# Patient Record
Sex: Female | Born: 1985
Health system: Southern US, Community
[De-identification: ages and names within clinical notes are randomized; demographics above are authoritative.]

---

## 2009-11-09 ENCOUNTER — Emergency Department: Payer: Self-pay | Admitting: Emergency Medicine

## 2009-12-31 ENCOUNTER — Emergency Department: Payer: Self-pay | Admitting: Emergency Medicine

## 2010-07-07 ENCOUNTER — Emergency Department: Payer: Self-pay | Admitting: Emergency Medicine

## 2011-01-01 ENCOUNTER — Observation Stay: Payer: Self-pay | Admitting: Obstetrics & Gynecology

## 2011-04-22 ENCOUNTER — Emergency Department: Payer: Self-pay | Admitting: Unknown Physician Specialty

## 2011-12-06 ENCOUNTER — Emergency Department: Payer: Self-pay | Admitting: Emergency Medicine

## 2011-12-06 LAB — COMPREHENSIVE METABOLIC PANEL
Alkaline Phosphatase: 59 U/L (ref 50–136)
Anion Gap: 5 — ABNORMAL LOW (ref 7–16)
BUN: 14 mg/dL (ref 7–18)
Bilirubin,Total: 0.6 mg/dL (ref 0.2–1.0)
Calcium, Total: 9.2 mg/dL (ref 8.5–10.1)
Co2: 28 mmol/L (ref 21–32)
EGFR (African American): >60
EGFR (Non-African Amer.): >60
Glucose: 90 mg/dL (ref 65–99)
Osmolality: 285 (ref 275–301)
Potassium: 3.9 mmol/L (ref 3.5–5.1)
SGOT(AST): 27 U/L (ref 15–37)
Sodium: 143 mmol/L (ref 136–145)
Total Protein: 7.8 g/dL (ref 6.4–8.2)

## 2011-12-06 LAB — CBC
HGB: 13.6 g/dL (ref 12.0–16.0)
MCH: 29.1 pg (ref 26.0–34.0)
MCHC: 32.3 g/dL (ref 32.0–36.0)
Platelet: 305 10*3/uL (ref 150–440)
RBC: 4.67 10*6/uL (ref 3.80–5.20)

## 2011-12-06 LAB — TSH: Thyroid Stimulating Horm: 0.54 u[IU]/mL

## 2012-02-24 ENCOUNTER — Emergency Department: Payer: Self-pay | Admitting: Emergency Medicine

## 2012-06-28 ENCOUNTER — Inpatient Hospital Stay: Payer: Self-pay | Admitting: Psychiatry

## 2012-06-28 LAB — DRUG SCREEN, URINE
Amphetamines, Ur Screen: NEGATIVE (ref ?–1000)
Benzodiazepine, Ur Scrn: POSITIVE (ref ?–200)
Cocaine Metabolite,Ur ~~LOC~~: POSITIVE (ref ?–300)
MDMA (Ecstasy)Ur Screen: NEGATIVE (ref ?–500)
Opiate, Ur Screen: POSITIVE (ref ?–300)

## 2012-06-28 LAB — CBC
HCT: 43.8 % (ref 35.0–47.0)
HGB: 14.3 g/dL (ref 12.0–16.0)
MCH: 30.1 pg (ref 26.0–34.0)
Platelet: 340 10*3/uL (ref 150–440)
RBC: 4.75 10*6/uL (ref 3.80–5.20)
RDW: 13.1 % (ref 11.5–14.5)
WBC: 11.2 10*3/uL — ABNORMAL HIGH (ref 3.6–11.0)

## 2012-06-28 LAB — COMPREHENSIVE METABOLIC PANEL
Alkaline Phosphatase: 61 U/L (ref 50–136)
BUN: 16 mg/dL (ref 7–18)
Bilirubin,Total: 0.3 mg/dL (ref 0.2–1.0)
Calcium, Total: 8.9 mg/dL (ref 8.5–10.1)
Chloride: 105 mmol/L (ref 98–107)
Creatinine: 0.92 mg/dL (ref 0.60–1.30)
EGFR (African American): >60
EGFR (Non-African Amer.): >60
Glucose: 84 mg/dL (ref 65–99)
Osmolality: 282 (ref 275–301)
Potassium: 4.1 mmol/L (ref 3.5–5.1)
SGPT (ALT): 37 U/L (ref 12–78)
Sodium: 141 mmol/L (ref 136–145)

## 2012-06-28 LAB — URINALYSIS, COMPLETE
Bilirubin,UR: NEGATIVE
Blood: NEGATIVE
Nitrite: NEGATIVE
Ph: 6 (ref 4.5–8.0)
Protein: NEGATIVE
RBC,UR: 4 /HPF (ref 0–5)
Squamous Epithelial: 4
WBC UR: 6 /HPF (ref 0–5)

## 2012-06-28 LAB — PREGNANCY, URINE: Pregnancy Test, Urine: NEGATIVE m[IU]/mL

## 2012-06-28 LAB — ETHANOL: Ethanol: <3 mg/dL

## 2012-06-28 LAB — TSH: Thyroid Stimulating Horm: 1.73 u[IU]/mL

## 2012-06-29 LAB — BEHAVIORAL MEDICINE 1 PANEL
Albumin: 3.2 g/dL — ABNORMAL LOW (ref 3.4–5.0)
Alkaline Phosphatase: 48 U/L — ABNORMAL LOW (ref 50–136)
Anion Gap: 6 — ABNORMAL LOW (ref 7–16)
BUN: 22 mg/dL — ABNORMAL HIGH (ref 7–18)
Basophil #: 0.1 10*3/uL (ref 0.0–0.1)
Basophil %: 0.6 %
Bilirubin,Total: 0.1 mg/dL — ABNORMAL LOW (ref 0.2–1.0)
Calcium, Total: 8.4 mg/dL — ABNORMAL LOW (ref 8.5–10.1)
Chloride: 109 mmol/L — ABNORMAL HIGH (ref 98–107)
Co2: 27 mmol/L (ref 21–32)
Creatinine: 0.7 mg/dL (ref 0.60–1.30)
EGFR (African American): >60
EGFR (Non-African Amer.): >60
Eosinophil #: 0.2 10*3/uL (ref 0.0–0.7)
Eosinophil %: 2.2 %
Glucose: 88 mg/dL (ref 65–99)
HCT: 39.5 % (ref 35.0–47.0)
HGB: 12.8 g/dL (ref 12.0–16.0)
Lymphocyte #: 3.2 10*3/uL (ref 1.0–3.6)
Lymphocyte %: 31.3 %
MCH: 30 pg (ref 26.0–34.0)
MCHC: 32.5 g/dL (ref 32.0–36.0)
MCV: 93 fL (ref 80–100)
Monocyte #: 0.8 x10 3/mm (ref 0.2–0.9)
Monocyte %: 7.7 %
Neutrophil #: 5.9 10*3/uL (ref 1.4–6.5)
Neutrophil %: 58.2 %
Osmolality: 286 (ref 275–301)
Platelet: 307 10*3/uL (ref 150–440)
Potassium: 4 mmol/L (ref 3.5–5.1)
RBC: 4.26 10*6/uL (ref 3.80–5.20)
RDW: 13.1 % (ref 11.5–14.5)
SGOT(AST): 25 U/L (ref 15–37)
SGPT (ALT): 29 U/L (ref 12–78)
Sodium: 142 mmol/L (ref 136–145)
Thyroid Stimulating Horm: 0.687 u[IU]/mL
Total Protein: 6.3 g/dL — ABNORMAL LOW (ref 6.4–8.2)
WBC: 10.1 10*3/uL (ref 3.6–11.0)

## 2012-06-29 LAB — URINALYSIS, COMPLETE
Bacteria: NONE SEEN
Bilirubin,UR: NEGATIVE
Blood: NEGATIVE
Glucose,UR: NEGATIVE mg/dL (ref 0–75)
Ketone: NEGATIVE
Nitrite: NEGATIVE
Ph: 7 (ref 4.5–8.0)
Protein: NEGATIVE
RBC,UR: 11 /HPF (ref 0–5)
Specific Gravity: 1.02 (ref 1.003–1.030)
Squamous Epithelial: 24
WBC UR: 17 /HPF (ref 0–5)

## 2012-10-17 ENCOUNTER — Inpatient Hospital Stay: Payer: Self-pay | Admitting: Psychiatry

## 2012-10-17 LAB — COMPREHENSIVE METABOLIC PANEL
Albumin: 4.1 g/dL (ref 3.4–5.0)
Alkaline Phosphatase: 59 U/L (ref 50–136)
BUN: 7 mg/dL (ref 7–18)
Bilirubin,Total: 0.9 mg/dL (ref 0.2–1.0)
Chloride: 108 mmol/L — ABNORMAL HIGH (ref 98–107)
Co2: 27 mmol/L (ref 21–32)
Creatinine: 0.89 mg/dL (ref 0.60–1.30)
EGFR (African American): >60
EGFR (Non-African Amer.): >60
Potassium: 3.3 mmol/L — ABNORMAL LOW (ref 3.5–5.1)
SGOT(AST): 45 U/L — ABNORMAL HIGH (ref 15–37)
SGPT (ALT): 36 U/L (ref 12–78)
Total Protein: 7.4 g/dL (ref 6.4–8.2)

## 2012-10-17 LAB — URINALYSIS, COMPLETE
Glucose,UR: NEGATIVE mg/dL (ref 0–75)
Nitrite: NEGATIVE
Ph: 5 (ref 4.5–8.0)
Specific Gravity: 1.029 (ref 1.003–1.030)
WBC UR: 13 /HPF (ref 0–5)

## 2012-10-17 LAB — CBC WITH DIFFERENTIAL/PLATELET
Basophil %: 0.6 %
Eosinophil #: 0.2 10*3/uL (ref 0.0–0.7)
Eosinophil %: 1.4 %
Lymphocyte #: 1.9 10*3/uL (ref 1.0–3.6)
Lymphocyte %: 16.7 %
MCHC: 33.6 g/dL (ref 32.0–36.0)
MCV: 90 fL (ref 80–100)
Monocyte %: 8.7 %
Neutrophil #: 8.1 10*3/uL — ABNORMAL HIGH (ref 1.4–6.5)
Neutrophil %: 72.6 %
Platelet: 247 10*3/uL (ref 150–440)
RBC: 4.25 10*6/uL (ref 3.80–5.20)
RDW: 12.7 % (ref 11.5–14.5)

## 2012-10-17 LAB — CBC
HGB: 13.5 g/dL (ref 12.0–16.0)
MCH: 29.9 pg (ref 26.0–34.0)
Platelet: 276 10*3/uL (ref 150–440)
RBC: 4.52 10*6/uL (ref 3.80–5.20)
RDW: 12.6 % (ref 11.5–14.5)
WBC: 23.1 10*3/uL — ABNORMAL HIGH (ref 3.6–11.0)

## 2012-10-17 LAB — DRUG SCREEN, URINE
Amphetamines, Ur Screen: NEGATIVE (ref ?–1000)
Barbiturates, Ur Screen: NEGATIVE (ref ?–200)
Benzodiazepine, Ur Scrn: POSITIVE (ref ?–200)
Cannabinoid 50 Ng, Ur ~~LOC~~: POSITIVE (ref ?–50)
Cocaine Metabolite,Ur ~~LOC~~: POSITIVE (ref ?–300)
MDMA (Ecstasy)Ur Screen: NEGATIVE (ref ?–500)
Methadone, Ur Screen: NEGATIVE (ref ?–300)
Opiate, Ur Screen: NEGATIVE (ref ?–300)
Phencyclidine (PCP) Ur S: NEGATIVE (ref ?–25)

## 2012-10-17 LAB — WET PREP, GENITAL

## 2012-10-17 LAB — ETHANOL: Ethanol %: <0.003 % (ref 0.000–0.080)

## 2012-10-17 LAB — GC/CHLAMYDIA PROBE AMP

## 2013-02-10 ENCOUNTER — Ambulatory Visit: Payer: Self-pay | Admitting: Emergency Medicine

## 2013-02-15 ENCOUNTER — Ambulatory Visit: Payer: Self-pay | Admitting: Family Medicine

## 2013-02-15 ENCOUNTER — Emergency Department: Payer: Self-pay | Admitting: Emergency Medicine

## 2013-02-21 ENCOUNTER — Emergency Department: Payer: Self-pay | Admitting: Emergency Medicine

## 2013-02-21 LAB — CBC
MCHC: 33.2 g/dL (ref 32.0–36.0)
MCV: 91 fL (ref 80–100)
Platelet: 235 10*3/uL (ref 150–440)
RDW: 12.6 % (ref 11.5–14.5)
WBC: 12.1 10*3/uL — ABNORMAL HIGH (ref 3.6–11.0)

## 2013-02-21 LAB — BASIC METABOLIC PANEL
BUN: 8 mg/dL (ref 7–18)
Chloride: 108 mmol/L — ABNORMAL HIGH (ref 98–107)
Co2: 27 mmol/L (ref 21–32)
EGFR (African American): >60
EGFR (Non-African Amer.): >60
Osmolality: 273 (ref 275–301)
Sodium: 138 mmol/L (ref 136–145)

## 2013-02-22 LAB — GC/CHLAMYDIA PROBE AMP

## 2013-06-10 ENCOUNTER — Emergency Department: Payer: Self-pay | Admitting: Emergency Medicine

## 2013-06-10 LAB — URINALYSIS, COMPLETE
Bilirubin,UR: NEGATIVE
Blood: NEGATIVE
GLUCOSE, UR: NEGATIVE mg/dL (ref 0–75)
LEUKOCYTE ESTERASE: NEGATIVE
Nitrite: NEGATIVE
PH: 5 (ref 4.5–8.0)
Specific Gravity: 1.029 (ref 1.003–1.030)
WBC UR: 6 /HPF (ref 0–5)

## 2013-06-10 LAB — BASIC METABOLIC PANEL
ANION GAP: 5 — AB (ref 7–16)
BUN: 7 mg/dL (ref 7–18)
CALCIUM: 9.1 mg/dL (ref 8.5–10.1)
Chloride: 105 mmol/L (ref 98–107)
Co2: 26 mmol/L (ref 21–32)
Creatinine: 0.58 mg/dL — ABNORMAL LOW (ref 0.60–1.30)
EGFR (African American): >60
Glucose: 85 mg/dL (ref 65–99)
OSMOLALITY: 269 (ref 275–301)
Potassium: 4 mmol/L (ref 3.5–5.1)
Sodium: 136 mmol/L (ref 136–145)

## 2013-06-10 LAB — CBC WITH DIFFERENTIAL/PLATELET
BASOS PCT: 0.2 %
Basophil #: 0 10*3/uL (ref 0.0–0.1)
Eosinophil #: 0 10*3/uL (ref 0.0–0.7)
Eosinophil %: 0.1 %
HCT: 39 % (ref 35.0–47.0)
HGB: 13.1 g/dL (ref 12.0–16.0)
LYMPHS ABS: 0.9 10*3/uL — AB (ref 1.0–3.6)
Lymphocyte %: 4.4 %
MCH: 30.9 pg (ref 26.0–34.0)
MCHC: 33.6 g/dL (ref 32.0–36.0)
MCV: 92 fL (ref 80–100)
MONO ABS: 0.8 x10 3/mm (ref 0.2–0.9)
MONOS PCT: 4.1 %
NEUTROS ABS: 17.6 10*3/uL — AB (ref 1.4–6.5)
Neutrophil %: 91.2 %
Platelet: 239 10*3/uL (ref 150–440)
RBC: 4.24 10*6/uL (ref 3.80–5.20)
RDW: 12.8 % (ref 11.5–14.5)
WBC: 19.3 10*3/uL — AB (ref 3.6–11.0)

## 2013-06-13 ENCOUNTER — Ambulatory Visit: Payer: Self-pay | Admitting: Family Medicine
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
## 2014-08-01 NOTE — Discharge Summary (Signed)
PATIENT NAME:  Debra RowanCLORE, Else L MR#:  [id number] DATE OF BIRTH:  [birth year]  DATE OF ADMISSION:  06/28/2012 DATE OF DISCHARGE:  07/02/2012  HOSPITAL COURSE: See dictated history and physical for details of admission. This 29 year old woman was petitioned for commitment after being found oversedated, probably having overdosed on benzodiazepines. The patient has consistently denied any suicidal ideation. She has not shown any dangerous or violent behavior in the hospital. She did not show any significant problems from withdrawal on medication. Benzodiazepines were discontinued and her affect and mood remained stable. The patient was counseled about the risks of abuse of benzodiazepine medications. Her plan is to follow up with Dr. Janeece RiggersSu in the community. She is to continue taking her current medication combination of Lamictal and Seroquel. The patient was treated with these medications and was monitored for any signs of withdrawal. She was treated also with daily individual and group psychotherapy. She showed improved insight.   DISCHARGE MEDICATIONS: Seroquel 150 mg at bedtime, Lamictal 200 mg twice a day.   LABORATORY RESULTS: Urinalysis showed positive leukocyte esterase, mixed red and white cells. She was treated for urinary tract infection during her hospital stay and has completed the treatment. Chemistry panel on admission showed chloride slightly elevated at 109, BUN elevated at 22, calcium low 8.4, bilirubin low at 0.1, alkaline phosphatase low at 48. CBC normal. Pregnancy test negative. Drug screen positive for cocaine and opiates and benzodiazepines.   MENTAL STATUS EXAM AT DISCHARGE: Neatly dressed and reasonably well-groomed woman who looks her stated age. Cooperative with the interview. Good eye contact, normal psychomotor activity. Speech is normal in rate, tone and volume. Affect mildly anxious, reactive and appropriate. Mood stated as okay. Thoughts lucid with no loosening of associations or  evidence of delusions. Denies auditory or visual hallucinations. Denies suicidal or homicidal ideation. Shows improved judgment and insight, normal intelligence,   DISPOSITION: Discharge home. Follow up with Dr. Janeece RiggersSu.   DIAGNOSIS PRINCIPAL AND PRIMARY: AXIS I: Bipolar disorder, not otherwise specified.    SECONDARY DIAGNOSES: AXIS I: Opiate and cocaine abuse, benzodiazepine dependence.   AXIS II: Deferred.   AXIS III: No diagnosis.   AXIS IV: Moderate from chronic illness and diminishing job prospects.  AXIS V: Functioning at time of discharge 50.      ____________________________ Audery AmelJohn T. Tyerra Loretto, MD jtc:cc D: 07/02/2012 17:32:52 ET T: 07/02/2012 20:59:34 ET JOB#: [id number]  cc: Audery AmelJohn T. Pahola Dimmitt, MD, <Dictator> Audery AmelJOHN T Charlynn Salih MD ELECTRONICALLY SIGNED 07/03/2012 11:18

## 2014-08-01 NOTE — Consult Note (Signed)
PATIENT NAME:  Debra Novak, Debra Novak MR#:  [id number] DATE OF BIRTH:  1985/10/11  DATE OF CONSULTATION:  06/28/2012  REFERRING PHYSICIAN:  Dorothea Glassman, MD CONSULTING PHYSICIAN:  Ardeen Fillers. Garnetta Buddy, MD  REASON FOR CONSULTATION:  Overdose on medication.   HISTORY OF PRESENT ILLNESS:  The patient is a 29 year old white female who presented to the ED after she overdosed on 30 plus pills off Klonopin. She presented to the ED oversedated, unable to state what happened to the pills and appeared to be very drowsy. She has a history of severe depression and is scheduled for cervical dysplasia surgery tomorrow at [facility]. She was unable to provide much information during the interview. Her pupils are dilated and she reported that she was given a Klonopin prescription for 90 pills on June 25, 2012. However, she was found oversedated at her home and at least 40 pills are missing from the bottle. The patient denied any suicidal ideations. She reported that she lives alone and nobody has access to her pills. She was assessed by Dr. Janeece Riggers in January and at that time she reported that she was struggling with her life. She admits to feeling depressed with crying spells, anhedonia, feeling hopeless, helpless as well as having passive suicidal thoughts. The patient during my interview reported that she has a history of cervical dysplasia which was diagnosed in 2011. She reported that she goes to [facility] on a regular basis and they have scheduled an outpatient procedure called LEEP so they can treat her for the same. She stated that she is very tired of this thing and she wants help. However, she was very agitated. When I asked her about her drug screen positive for cocaine, she reported that she used cocaine almost 2 weeks ago for 3 days in a row. She was minimizing her use of drugs and Klonopin at this time. She demonstrated poor insight about her use of Klonopin and the drugs.   PAST PSYCHIATRIC HISTORY:  The patient has a history of  depression and she has been following her physician on a regular basis, Dr. Janeece Riggers, who has been prescribing her medications. She is currently taking Seroquel, lamotrigine and Klonopin. She was unable to describe if the medications are effective as she is very sedated at this time. She denied any previous history of suicide attempts.   MEDICAL HISTORY:  The patient has history of cervical dysplasia. She also has history of D and C in the past.   CURRENT MEDICATIONS:  Seroquel XR 150 mg daily, lamotrigine 150 mg p.o. b.i.d., clonazepam 1 mg 2 to 3 times per day.   SOCIAL HISTORY:  The patient currently lives by herself.   DRUG HISTORY:  The patient reported that she used cocaine 2 to 3 weeks ago; however, her drug screen is positive at this time.   REVIEW OF SYSTEMS: CONSTITUTIONAL: The patient appeared very tired.  EYES: Her pupils appeared dilated.  RESPIRATORY: No shortness of breath or cough noted.  CARDIOVASCULAR: No chest pain, orthopnea.  GASTROINTESTINAL: No abdominal pain, nausea, vomiting or diarrhea.  GENITOURINARY: No incontinence or frequency.  ENDOCRINE: No heat or cold intolerance.  LYMPHATIC: No anemia or easy bruising.   PHYSICAL EXAMINATION:  VITAL SIGNS: Temperature 98, pulse 93, respirations 14, blood pressure 133/59.   LABORATORY DATA:  Glucose is 84, BUN 16, creatinine 0.92, sodium 141, potassium 4.1, chloride 105, bicarbonate 31, anion gap 5 and osmolality 282. Blood alcohol is less than 3. Protein is 7.7, albumin 3.9, bilirubin 0.3, alkaline  phosphatase 61, AST 31, ALT 37. TSH is 1.73. Urine drug screen is positive for benzodiazepines, cocaine and opioids. WBC is 11.2, RBC 4.75, hemoglobin 14.3, hematocrit 43.8, platelet count 340, MCH 30.1 and MCHC 32.7.   MENTAL STATUS EXAMINATION:  The patient is a disheveled-appearing female who was lying in the ED bed. Her eye contact was poor. Speech was slurred. Mood was depressed. Affect was anxious. Thought process was tangential.  Thought content was non-delusional. She demonstrated poor insight and judgment.   DIAGNOSTIC IMPRESSION: AXIS I:  1.  Bipolar disorder, not otherwise specified.  2.  Sedative, hypnotic, anxiolytic dependence.  3.  Cocaine abuse.  AXIS II: None.  AXIS III: Please review the medical history.   TREATMENT PLAN:   1.  The patient will be started on involuntary commitment at this time.  2.  She will be started on the CIWA protocol for Klonopin withdrawal.  3.  She will be continued on lamotrigine 100 mg at bedtime.  4.  She will be continued on Seroquel 50 mg at bedtime.  5.  Her medications will be adjusted in the inpatient behavioral health unit. Treatment team is to follow.   Thank you for allowing me to participate in the care of this patient.    ____________________________ Ardeen FillersUzma S. Garnetta BuddyFaheem, MD usf:si D: 06/28/2012 17:05:48 ET T: 06/28/2012 18:03:52 ET JOB#: [id number]  cc: Ardeen FillersUzma S. Garnetta BuddyFaheem, MD, <Dictator> Rhunette CroftUZMA S Jassmin Kemmerer MD ELECTRONICALLY SIGNED 07/02/2012 15:31

## 2014-11-25 IMAGING — US US PELV - US TRANSVAGINAL
1 series · 13 of 25 positions shown · non-contrast
Comparison: 02/15/2013.

CLINICAL DATA: Right lower quadrant pain. History of an
intrauterine device imbedded in the myometrium.

EXAM:
TRANSABDOMINAL AND TRANSVAGINAL ULTRASOUND OF PELVIS
DOPPLER ULTRASOUND OF OVARIES
TECHNIQUE: Both transabdominal and transvaginal ultrasound examinations of the
pelvis were performed. Transabdominal technique was performed for
global imaging of the pelvis including uterus, ovaries, adnexal
regions, and pelvic cul-de-sac.
It was necessary to proceed with endovaginal exam following the
transabdominal exam to visualize the uterus and ovaries in better
detail. Color and duplex Doppler ultrasound was utilized to evaluate
blood flow to the ovaries.

[Series 1: us pelv - us transvaginal · 0.28mm/px · 13 of 77 slices shown]
[im 1/77]
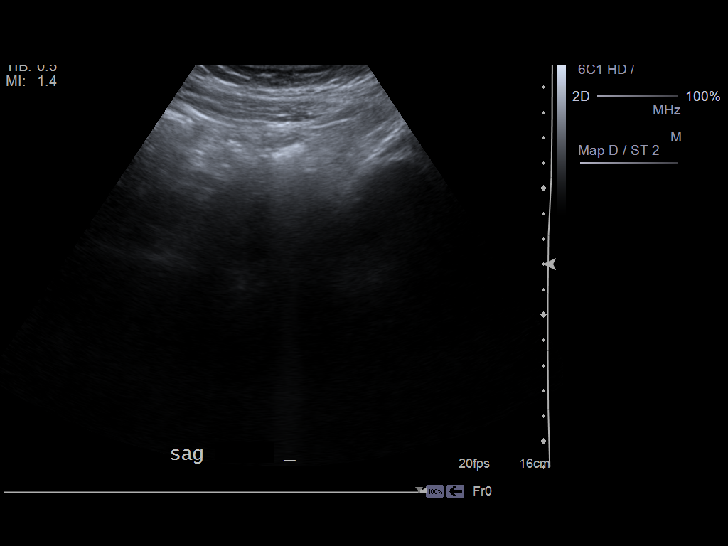
[im 7/77]
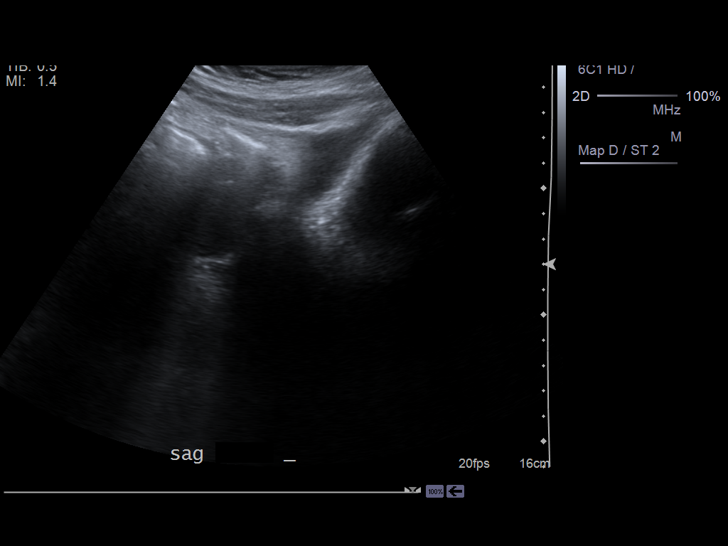
[im 13/77]
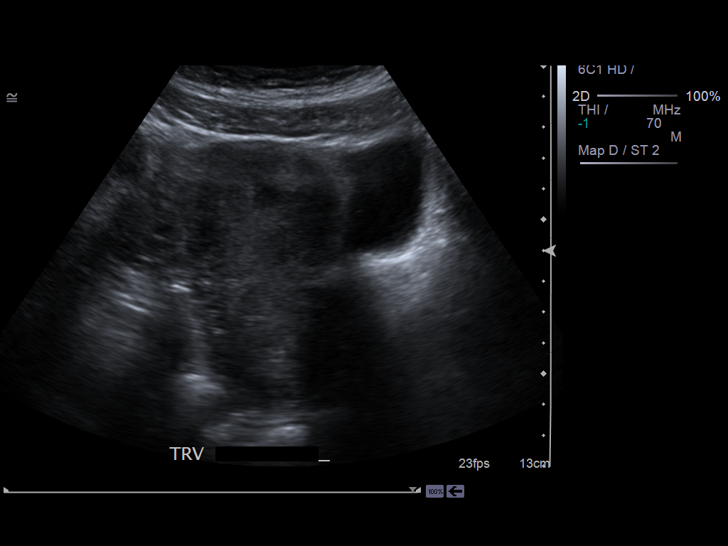
[im 20/77]
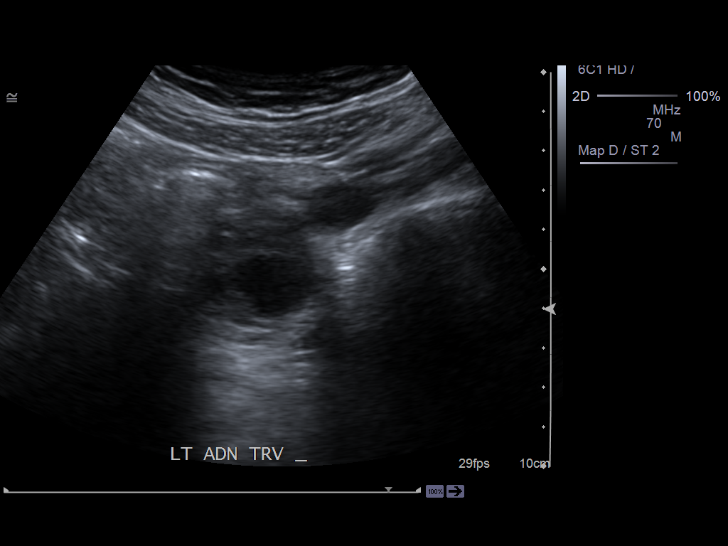
[im 26/77]
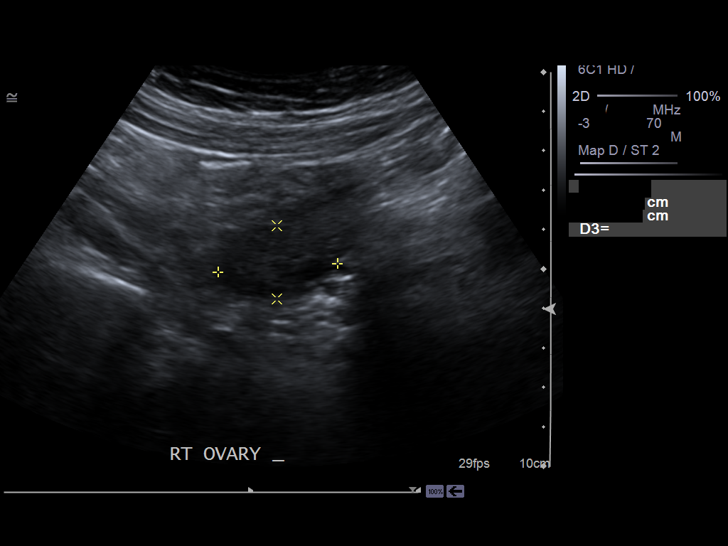
[im 32/77]
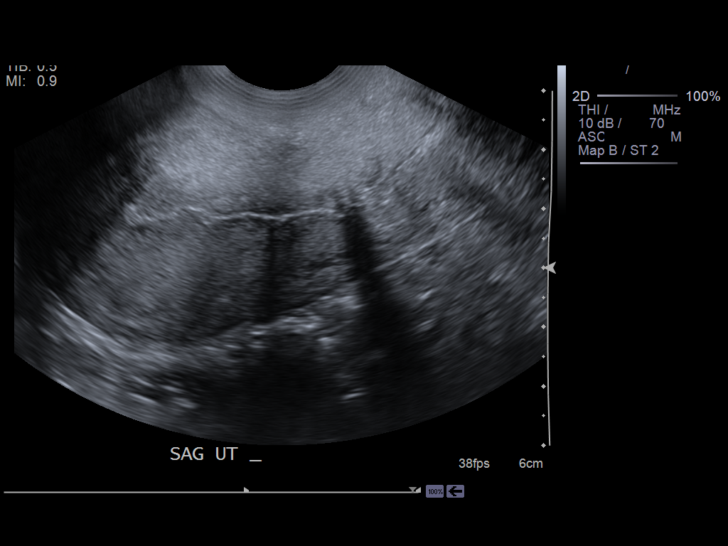
[im 39/77]
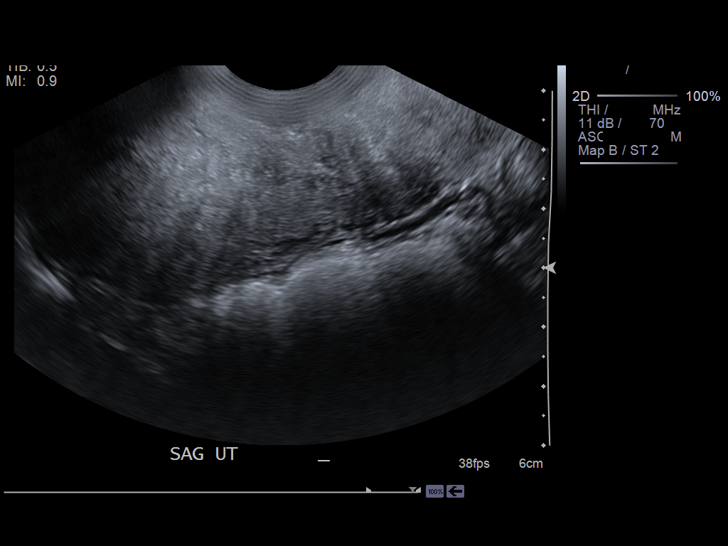
[im 45/77]
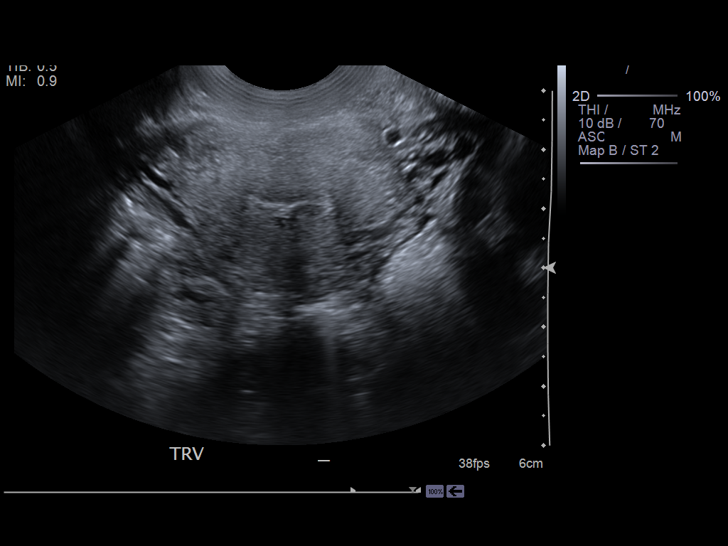
[im 51/77]
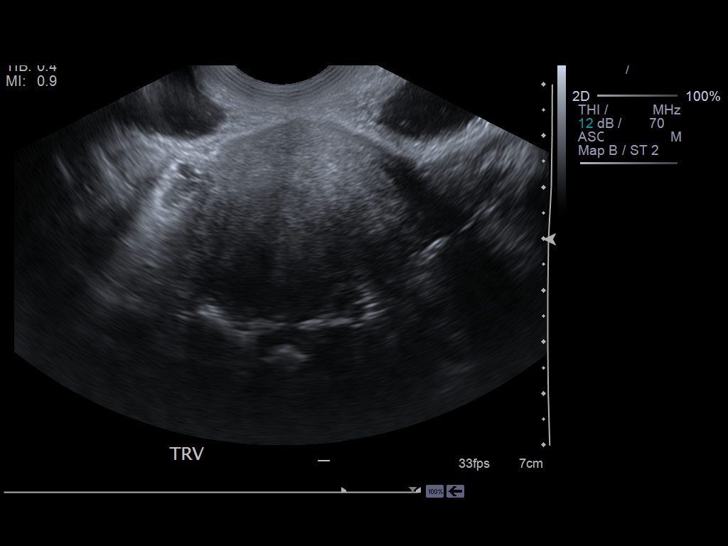
[im 58/77]
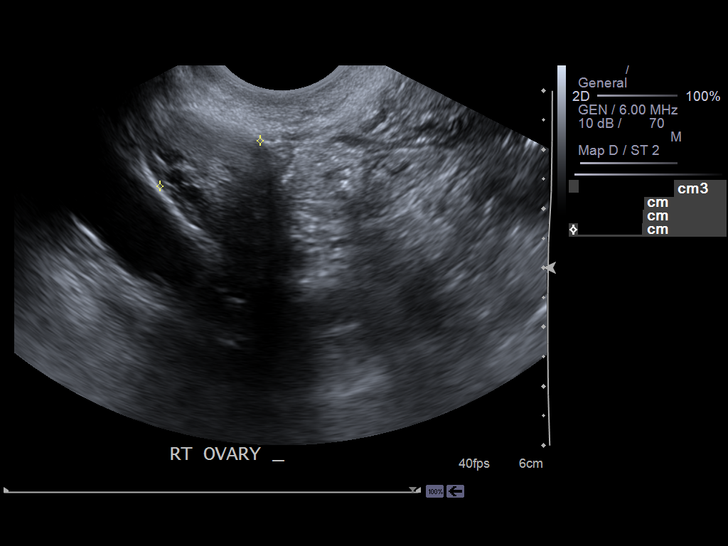
[im 64/77]
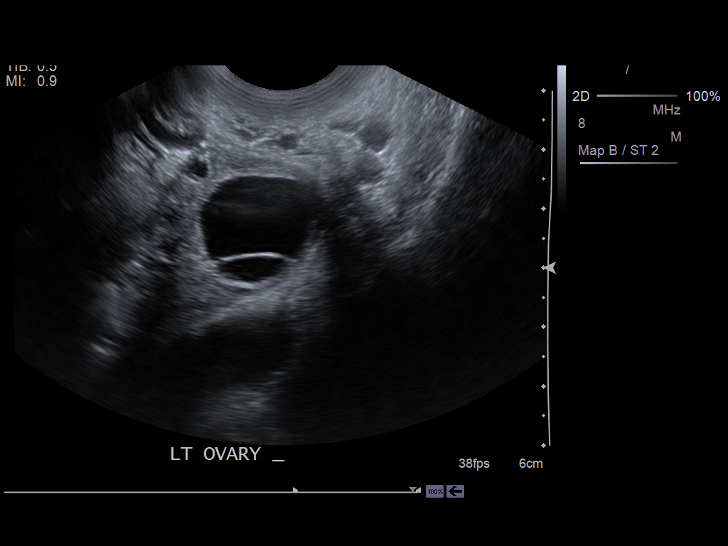
[im 70/77]
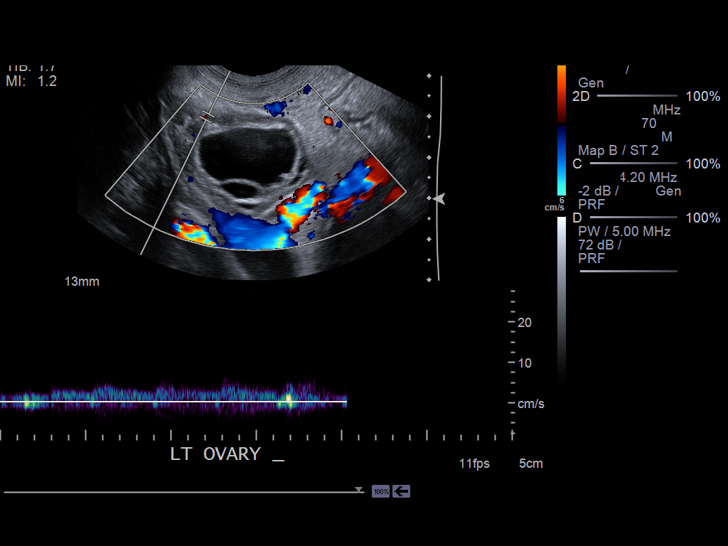
[im 77/77]
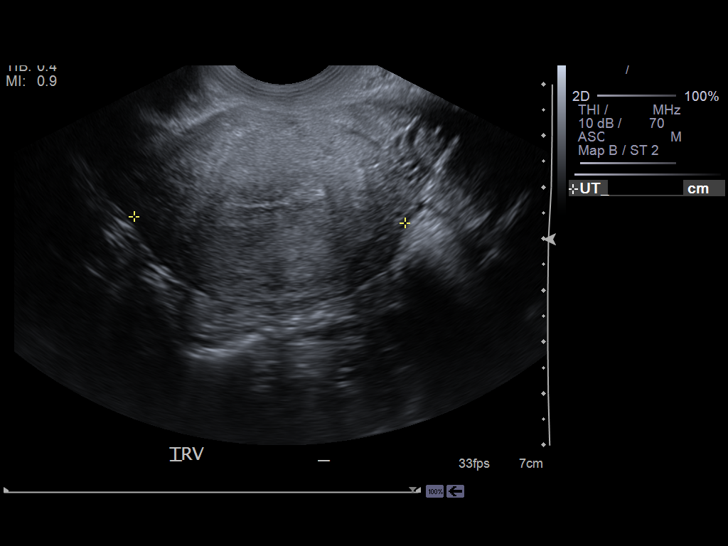

[13 of 25 positions shown; findings below may reference images not displayed]

FINDINGS: Uterus

Measurements: 8.4 x 5.3 x 4.2 cm. The previously suggested
intrauterine device imbedded in the myometrium to the right of the
cervical canal and lower uterine segment is not clearly visualized
today in the axial plane. However, this is well-visualized in the
sagittal plane.

Endometrium

Thickness: 3.3 mm.  No focal abnormality visualized.

Right ovary

Measurements: 3.6 x 1.9 x 1.6 cm. Normal appearance/no adnexal mass.

Left ovary

Measurements: 3.7 x 2.4 x 2.2 cm. Normal appearance/no adnexal mass.

Pulsed Doppler evaluation of both ovaries demonstrates normal
low-resistance arterial and venous waveforms.

Other findings

No free fluid.
IMPRESSION: The intrauterine device continues to appear malpositioned to the
right of the cervical canal and lower uterine segment endometrium.
Otherwise, normal examination.

## 2016-04-16 ENCOUNTER — Emergency Department
Admission: EM | Admit: 2016-04-16 | Discharge: 2016-04-16 | Disposition: A | Discharge status: Home / Self Care | Payer: Self-pay | Attending: Emergency Medicine | Admitting: Emergency Medicine

## 2016-04-16 ENCOUNTER — Encounter: Payer: Self-pay | Admitting: Emergency Medicine

## 2016-04-16 DIAGNOSIS — Y999 Unspecified external cause status: Secondary | ICD-10-CM | POA: Insufficient documentation

## 2016-04-16 DIAGNOSIS — Y929 Unspecified place or not applicable: Secondary | ICD-10-CM | POA: Insufficient documentation

## 2016-04-16 DIAGNOSIS — X58XXXA Exposure to other specified factors, initial encounter: Secondary | ICD-10-CM | POA: Insufficient documentation

## 2016-04-16 DIAGNOSIS — S025XXA Fracture of tooth (traumatic), initial encounter for closed fracture: Secondary | ICD-10-CM | POA: Insufficient documentation

## 2016-04-16 DIAGNOSIS — Y939 Activity, unspecified: Secondary | ICD-10-CM | POA: Insufficient documentation

## 2016-04-16 DIAGNOSIS — F17210 Nicotine dependence, cigarettes, uncomplicated: Secondary | ICD-10-CM | POA: Insufficient documentation

## 2016-04-16 MED ORDER — IBUPROFEN 600 MG PO TABS
600.0000 mg | ORAL_TABLET | Freq: Once | ORAL | Status: AC
  Administered 2016-04-16: 600 mg via ORAL
  Filled 2016-04-16: qty 1

## 2016-04-16 MED ORDER — TRAMADOL HCL 50 MG PO TABS: 50.0000 mg | ORAL_TABLET | Freq: Four times a day (QID) | ORAL | 0 refills | Status: AC | PRN

## 2016-04-16 MED ORDER — TRAMADOL HCL 50 MG PO TABS
50.0000 mg | ORAL_TABLET | Freq: Once | ORAL | Status: AC
  Administered 2016-04-16: 50 mg via ORAL
  Filled 2016-04-16: qty 1

## 2016-04-16 MED ORDER — LIDOCAINE VISCOUS 2 % MT SOLN: 5.0000 mL | Freq: Four times a day (QID) | OROMUCOSAL | 0 refills | Status: AC | PRN

## 2016-04-16 MED ORDER — LIDOCAINE VISCOUS 2 % MT SOLN
15.0000 mL | Freq: Once | OROMUCOSAL | Status: AC
  Administered 2016-04-16: 15 mL via OROMUCOSAL
  Filled 2016-04-16: qty 15

## 2016-04-16 MED ORDER — IBUPROFEN 600 MG PO TABS: 600.0000 mg | ORAL_TABLET | Freq: Three times a day (TID) | ORAL | 0 refills | Status: AC | PRN

## 2016-04-16 NOTE — ED Provider Notes (Signed)
[facility] Emergency Department Provider Note   ____________________________________________   First MD Initiated Contact with Patient 04/16/16 1202     (approximate)  I have reviewed the triage vital signs and the nursing notes.   HISTORY  Chief Complaint Dental Pain    HPI Debra Novak is a 31 y.o. female patient complain of dental pain secondary to fractured upper molar on the right side.Patient stated tooth was fractured 1 month ago. Patient rates pain as 8/10. Patient described a pain as "sharp". Patient state no relief with over-the-counter ibuprofen or Tylenol.   History reviewed. No pertinent past medical history.  There are no active problems to display for this patient.   History reviewed. No pertinent surgical history.  Prior to Admission medications   Medication Sig Start Date End Date Taking? Authorizing Provider  ibuprofen (ADVIL,MOTRIN) 600 MG tablet Take 1 tablet (600 mg total) by mouth every 8 (eight) hours as needed. 04/16/16   Joni Reiningonald K Smith, PA-C  lidocaine (XYLOCAINE) 2 % solution Use as directed 5 mLs in the mouth or throat every 6 (six) hours as needed for mouth pain. 04/16/16   Joni Reiningonald K Smith, PA-C  traMADol (ULTRAM) 50 MG tablet Take 1 tablet (50 mg total) by mouth every 6 (six) hours as needed. 04/16/16 04/16/17  Joni Reiningonald K Smith, PA-C    Allergies Penicillins  No family history on file.  Social History Social History  Substance Use Topics  . Smoking status: Current Every Day Smoker    Packs/day: 0.50    Types: Cigarettes  . Smokeless tobacco: Not on file  . Alcohol use Not on file    Review of Systems Constitutional: No fever/chills Eyes: No visual changes. MVH:QIONGEENT:Dental pain. Cardiovascular: Denies chest pain. Respiratory: Denies shortness of breath. Gastrointestinal: No abdominal pain.  No nausea, no vomiting.  No diarrhea.  No constipation. Genitourinary: Negative for dysuria. Musculoskeletal: Negative for back  pain. Skin: Negative for rash. Neurological: Negative for headaches, focal weakness or numbness. Allergic/Immunilogical: Penicillin  ____________________________________________   PHYSICAL EXAM:  VITAL SIGNS: ED Triage Vitals [04/16/16 1057]  Enc Vitals Group     BP 136/70     Pulse Rate 77     Resp 18     Temp 98.4 F (36.9 C)     Temp src      SpO2 100 %     Weight 160 lb (72.6 kg)     Height 5\' 2"  (1.575 m)     Head Circumference      Peak Flow      Pain Score 8     Pain Loc      Pain Edu?      Excl. in GC?     Constitutional: Alert and oriented. Well appearing and in no acute distress. Eyes: Conjunctivae are normal. PERRL. EOMI. Head: Atraumatic. Nose: No congestion/rhinnorhea. Mouth/Throat: Mucous membranes are moist.  Oropharynx non-erythematous.Fractured tooth 14 Neck: No stridor.  No cervical spine tenderness to palpation. Hematological/Lymphatic/Immunilogical: No cervical lymphadenopathy. **}Cardiovascular: Normal rate, regular rhythm. Grossly normal heart sounds.  Good peripheral circulation. Respiratory: Normal respiratory effort.  No retractions. Lungs CTAB. Gastrointestinal: Soft and nontender. No distention. No abdominal bruits. No CVA tenderness. Musculoskeletal: No lower extremity tenderness nor edema.  No joint effusions. Neurologic:  Normal speech and language. No gross focal neurologic deficits are appreciated. No gait instability. Skin:  Skin is warm, dry and intact. No rash noted. Psychiatric: Mood and affect are normal. Speech and behavior are normal.  ____________________________________________  LABS (all labs ordered are listed, but only abnormal results are displayed)  Labs Reviewed - No data to display ____________________________________________  EKG   ____________________________________________  RADIOLOGY   ____________________________________________   PROCEDURES  Procedure(s) performed: None  Procedures  Critical  Care performed: No  ____________________________________________   INITIAL IMPRESSION / ASSESSMENT AND PLAN / ED COURSE  Pertinent labs & imaging results that were available during my care of the patient were reviewed by me and considered in my medical decision making (see chart for details).  Dental pain secondary to fractured tooth. Patient given discharge care instructions. Patient prescription for Vicoprofen tramadol. Patient advised follow [facility] dental [facility] for extraction.  Clinical Course      ____________________________________________   FINAL CLINICAL IMPRESSION(S) / ED DIAGNOSES  Final diagnoses:  Closed fracture of tooth, initial encounter      NEW MEDICATIONS STARTED DURING THIS VISIT:  New Prescriptions   IBUPROFEN (ADVIL,MOTRIN) 600 MG TABLET    Take 1 tablet (600 mg total) by mouth every 8 (eight) hours as needed.   LIDOCAINE (XYLOCAINE) 2 % SOLUTION    Use as directed 5 mLs in the mouth or throat every 6 (six) hours as needed for mouth pain.   TRAMADOL (ULTRAM) 50 MG TABLET    Take 1 tablet (50 mg total) by mouth every 6 (six) hours as needed.     Note:  This document was prepared using Dragon voice recognition software and may include unintentional dictation errors.    Joni Reining, PA-C 04/16/16 1211    Phineas Semen, MD 04/16/16 1213

## 2016-04-16 NOTE — ED Notes (Signed)
Pt c/o dental pain to RT lower x4days. Pt states she has been taking ibuprofen  With no relief.

## 2016-04-16 NOTE — ED Triage Notes (Signed)
R lower dental pain since broke tooth one month ago.

## 2019-12-09 ENCOUNTER — Ambulatory Visit: Payer: Self-pay

## 2019-12-24 ENCOUNTER — Ambulatory Visit: Payer: Medicaid Other
